# Patient Record
Sex: Female | Born: 1980
Health system: Southern US, Community
[De-identification: ages and names within clinical notes are randomized; demographics above are authoritative.]

---

## 2013-03-29 ENCOUNTER — Emergency Department (HOSPITAL_COMMUNITY)
Admission: EM | Admit: 2013-03-29 | Discharge: 2013-03-29 | Disposition: A | Discharge status: Home / Self Care | Payer: BC Managed Care – PPO | Attending: Emergency Medicine | Admitting: Emergency Medicine

## 2013-03-29 ENCOUNTER — Encounter (HOSPITAL_COMMUNITY): Payer: Self-pay | Admitting: Emergency Medicine

## 2013-03-29 DIAGNOSIS — J029 Acute pharyngitis, unspecified: Secondary | ICD-10-CM | POA: Insufficient documentation

## 2013-03-29 LAB — RAPID STREP SCREEN (MED CTR MEBANE ONLY): Streptococcus, Group A Screen (Direct): NEGATIVE

## 2013-03-29 MED ORDER — DEXAMETHASONE SODIUM PHOSPHATE 10 MG/ML IJ SOLN
10.0000 mg | Freq: Once | INTRAMUSCULAR | Status: AC
  Administered 2013-03-29: 10 mg via INTRAMUSCULAR
  Filled 2013-03-29: qty 1

## 2013-03-29 NOTE — ED Provider Notes (Signed)
CSN: [id number]     Arrival date & time 03/29/13  1550 History  This chart was scribed for non-physician practitioner, Teressa Lower, NP,working with Candyce Churn, MD, by Karle Plumber, ED Scribe.  This patient was seen in room TR08C/TR08C and the patient's care was started at 4:10 PM.  Chief Complaint  Patient presents with  . Sore Throat   The history is provided by the patient. No language interpreter was used.   HPI Comments:  Claire Ellison is a 32 y.o. female who presents to the Emergency Department complaining of generalized body aches, sore throat, and subjective fevers onset 3 days. She states her pain worsens with coughing. Pt states she has taken Zinc tablets and drank Ginseng for her symptoms.    History reviewed. No pertinent past medical history. History reviewed. No pertinent past surgical history. History reviewed. No pertinent family history. History  Substance Use Topics  . Smoking status: Never Smoker   . Smokeless tobacco: Not on file  . Alcohol Use: No   OB History   Grav Para Term Preterm Abortions TAB SAB Ect Mult Living                 Review of Systems  Constitutional: Positive for fever.  HENT: Positive for sore throat.   Neurological: Positive for headaches.  All other systems reviewed and are negative.    Allergies  Review of patient's allergies indicates no known allergies.  Home Medications  No current outpatient prescriptions on file. BP 117/67  Pulse 108  Temp(Src) 99.1 F (37.3 C) (Oral)  Resp 16  SpO2 98%  LMP 03/02/2013 Physical Exam  Nursing note and vitals reviewed. Constitutional: She is oriented to person, place, and time. She appears well-developed and well-nourished. No distress.  HENT:  Head: Normocephalic and atraumatic.  Mouth/Throat: No oropharyngeal exudate.  Throat mildly erythematous.   Eyes: Conjunctivae are normal. No scleral icterus.  Neck: Neck supple.  Cardiovascular: Normal rate and intact distal  pulses.   Pulmonary/Chest: Effort normal and breath sounds normal. No stridor. No respiratory distress. She has no wheezes. She has no rales.  Abdominal: Normal appearance. She exhibits no distension.  Neurological: She is alert and oriented to person, place, and time.  Skin: Skin is warm and dry. No rash noted.  Psychiatric: She has a normal mood and affect. Her behavior is normal.    ED Course  Procedures (including critical care time) DIAGNOSTIC STUDIES: Oxygen Saturation is 98% on RA, normal by my interpretation.   COORDINATION OF CARE: 4:14 PM- Will check strep test. Pt verbalizes understanding and agrees to plan.  Medications - No data to display  Labs Review Labs Reviewed  RAPID STREP SCREEN  CULTURE, GROUP A STREP   Imaging Review No results found.  EKG Interpretation   None       MDM   1. Pharyngitis    Likely viral:pt given steroids:don't need antibiotics at this time  I personally performed the services described in this documentation, which was scribed in my presence. The recorded information has been reviewed and is accurate.    Teressa Lower, NP 03/29/13 1649

## 2013-03-29 NOTE — ED Notes (Signed)
Pt reports sore throat, fever, headache, all over body aches, and laryngitis x4 days

## 2013-03-30 NOTE — ED Provider Notes (Signed)
Medical screening examination/treatment/procedure(s) were performed by non-physician practitioner and as supervising physician I was immediately available for consultation/collaboration.  Candyce Churn, MD 03/30/13 [phone]

## 2013-03-31 LAB — CULTURE, GROUP A STREP

## 2013-04-01 ENCOUNTER — Emergency Department (HOSPITAL_COMMUNITY)
Admission: EM | Admit: 2013-04-01 | Discharge: 2013-04-01 | Disposition: A | Discharge status: Home / Self Care | Payer: BC Managed Care – PPO | Source: Home / Self Care | Attending: Emergency Medicine | Admitting: Emergency Medicine

## 2013-04-01 ENCOUNTER — Emergency Department (INDEPENDENT_AMBULATORY_CARE_PROVIDER_SITE_OTHER): Payer: BC Managed Care – PPO

## 2013-04-01 ENCOUNTER — Encounter (HOSPITAL_COMMUNITY): Payer: Self-pay | Admitting: Emergency Medicine

## 2013-04-01 DIAGNOSIS — J040 Acute laryngitis: Secondary | ICD-10-CM

## 2013-04-01 LAB — POCT RAPID STREP A: Streptococcus, Group A Screen (Direct): NEGATIVE

## 2013-04-01 MED ORDER — AMOXICILLIN-POT CLAVULANATE 875-125 MG PO TABS: 1.0000 | ORAL_TABLET | Freq: Two times a day (BID) | ORAL | Status: AC

## 2013-04-01 MED ORDER — HYDROCOD POLST-CHLORPHEN POLST 10-8 MG/5ML PO LQCR: 5.0000 mL | Freq: Two times a day (BID) | ORAL | Status: AC | PRN

## 2013-04-01 MED ORDER — PREDNISONE 20 MG PO TABS: 20.0000 mg | ORAL_TABLET | Freq: Two times a day (BID) | ORAL | Status: AC

## 2013-04-01 NOTE — ED Provider Notes (Signed)
Chief Complaint:   Chief Complaint  Patient presents with  . 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 She went to the emergency room this past Sunday, 3 days ago. A rapid strep antigen was negative and her culture was negative as well. She was given Decadron, but feels no better today.  Review of Systems:  Other than noted above, the patient denies any of the following symptoms: Systemic:  No fevers, chills, sweats, weight loss or gain, fatigue, or tiredness. Eye:  No redness or discharge. ENT:  No ear pain, drainage, headache, nasal congestion, drainage, sinus pressure, difficulty swallowing, or sore throat. Neck:  No neck pain or swollen glands. Lungs:  No cough, sputum production, hemoptysis, wheezing, chest tightness, shortness of breath or chest pain. GI:  No abdominal pain, nausea, vomiting or diarrhea.  PMFSH:  Past medical history, family history, social history, meds, and allergies were reviewed.   Physical Exam:   Vital signs:  BP 99/56  Pulse 78  Temp(Src) 98.2 F (36.8 C) (Oral)  Resp 16  SpO2 99%  LMP 03/31/2013 General:  Alert and oriented.  In no distress.  Skin warm and dry. Eye:  No conjunctival injection or drainage. Lids were normal. ENT:  TMs and canals were normal, without erythema or inflammation.  Nasal mucosa was clear and uncongested, without drainage.  Mucous membranes were moist.  Pharynx was erythematous with cobblestoning.  There were no oral ulcerations or lesions. Neck:  Supple, no adenopathy, tenderness or mass. Lungs:  No respiratory distress.  Lungs were clear to auscultation, without wheezes, rales or rhonchi.  Breath sounds were clear and equal bilaterally.  Heart:   Regular rhythm, without gallops, murmers or rubs. Skin:  Clear, warm, and dry, without rash or lesions.  Radiology:  Dg Chest 2 View  04/01/2013   CLINICAL DATA:  Chest pain and shortness of breath; cough  EXAM: CHEST  2 VIEW  COMPARISON:  None.  FINDINGS: The lungs are clear. Heart size and pulmonary vascularity are normal. No adenopathy. No pneumothorax. No bone lesions.  IMPRESSION: No edema or consolidation.   Electronically Signed   By: Bretta Bang M.D.   On: 04/01/2013 15:00   Assessment:  The encounter diagnosis was Laryngitis.  She's had prolonged laryngitis, possibly with an element of sinusitis as well. She needs complete voice rest and was given a note to be out of work for 3 days.  Plan:   1.  Meds:  The following meds were prescribed:   Discharge Medication List as of 04/01/2013  3:16 PM    START taking these medications   Details  amoxicillin-clavulanate (AUGMENTIN) 875-125 MG per tablet Take 1 tablet by mouth 2 (two) times daily., Starting 04/01/2013, Until Discontinued, Print    chlorpheniramine-HYDROcodone (TUSSIONEX) 10-8 MG/5ML LQCR Take 5 mLs by mouth every 12 (twelve) hours as needed for cough., Starting 04/01/2013, Until Discontinued, Print    predniSONE (DELTASONE) 20 MG tablet Take 1 tablet (20 mg total) by mouth 2 (two) times daily., Starting 04/01/2013, Until Discontinued, Print        2.  Patient Education/Counseling:  The patient was given appropriate handouts, self care instructions, and instructed in symptomatic relief.    3.  Follow up:  The patient was  told to follow up if no better in 7 days, if becoming worse in any way, and given some red flag symptoms such as fever or difficulty breathing which would prompt immediate return.  Follow up here if no better in one week.      Reuben Likes, MD 04/01/13 [phone]

## 2013-04-01 NOTE — ED Notes (Signed)
Pt reports sore throat and fever.  She was seen in our ED 2 days ago and received an injection of decadron.  Today she c/o continued sore throat

## 2013-04-03 LAB — CULTURE, GROUP A STREP

## 2016-11-26 ENCOUNTER — Emergency Department (HOSPITAL_COMMUNITY): Payer: Managed Care, Other (non HMO)

## 2016-11-26 ENCOUNTER — Encounter (HOSPITAL_COMMUNITY): Payer: Self-pay

## 2016-11-26 ENCOUNTER — Emergency Department (HOSPITAL_COMMUNITY)
Admission: EM | Admit: 2016-11-26 | Discharge: 2016-11-27 | Disposition: A | Discharge status: Home / Self Care | Payer: Managed Care, Other (non HMO) | Attending: Emergency Medicine | Admitting: Emergency Medicine

## 2016-11-26 DIAGNOSIS — Y929 Unspecified place or not applicable: Secondary | ICD-10-CM | POA: Diagnosis not present

## 2016-11-26 DIAGNOSIS — Y998 Other external cause status: Secondary | ICD-10-CM | POA: Insufficient documentation

## 2016-11-26 DIAGNOSIS — S99921A Unspecified injury of right foot, initial encounter: Secondary | ICD-10-CM | POA: Diagnosis present

## 2016-11-26 DIAGNOSIS — Y9368 Activity, volleyball (beach) (court): Secondary | ICD-10-CM | POA: Diagnosis not present

## 2016-11-26 DIAGNOSIS — W010XXA Fall on same level from slipping, tripping and stumbling without subsequent striking against object, initial encounter: Secondary | ICD-10-CM | POA: Insufficient documentation

## 2016-11-26 DIAGNOSIS — S82821A Torus fracture of lower end of right fibula, initial encounter for closed fracture: Secondary | ICD-10-CM | POA: Diagnosis not present

## 2016-11-26 MED ORDER — HYDROCODONE-ACETAMINOPHEN 5-325 MG PO TABS
1.0000 | ORAL_TABLET | Freq: Once | ORAL | Status: AC
  Administered 2016-11-26: 1 via ORAL
  Filled 2016-11-26: qty 1

## 2016-11-26 NOTE — ED Provider Notes (Signed)
[facility]-EMERGENCY DEPT Provider Note   CSN: [id number] Arrival date & time: 11/26/16  2139 By signing my name below, I, Levon Hedger, attest that this documentation has been prepared under the direction and in the presence of non-physician practitioner, Kerrie Buffalo, NP. Electronically Signed: Levon Hedger, Scribe. 11/26/2016. 11:33 PM   History   Chief Complaint Chief Complaint  Patient presents with  . Foot Injury   HPI Claire Ellison is a 36 y.o. female who presents to the Emergency Department complaining of sudden onset, waxing and waning right ankle pain onset 30 minutes PTA. Pt was playing volleyball and jumped to hit the ball when came down on her right ankle and twisted it. Associated swelling noted to her right ankle. No OTC treatments tried for these symptoms PTA.  She denies any weakness or numbness. Pt has no other acute complaints or associated symptoms at this time.    The history is provided by the patient. No language interpreter was used.  Foot Injury   The incident occurred less than 1 hour ago. The injury mechanism was a fall. The pain is present in the right ankle. The pain is at a severity of 9/10. The pain is severe. The pain has been fluctuating since onset. Associated symptoms include inability to bear weight. Pertinent negatives include no numbness, no loss of motion, no muscle weakness, no loss of sensation and no tingling. She reports no foreign bodies present. The symptoms are aggravated by bearing weight, palpation and activity. She has tried nothing for the symptoms. The treatment provided no relief.   History reviewed. No pertinent past medical history.  There are no active problems to display for this patient.   History reviewed. No pertinent surgical history.  OB History    No data available      Home Medications    Prior to Admission medications   Medication Sig Start Date End Date Taking? Authorizing Provider  acetaminophen (TYLENOL) 500 MG tablet  Take 1,000 mg by mouth daily as needed for headache.    [provider]  amoxicillin-clavulanate (AUGMENTIN) 875-125 MG per tablet Take 1 tablet by mouth 2 (two) times daily. 04/01/13   Reuben Likes, MD  chlorpheniramine-HYDROcodone (TUSSIONEX) 10-8 MG/5ML LQCR Take 5 mLs by mouth every 12 (twelve) hours as needed for cough. 04/01/13   Reuben Likes, MD  diclofenac (VOLTAREN) 50 MG EC tablet Take 1 tablet (50 mg total) by mouth 2 (two) times daily. 11/27/16   Janne Napoleon, NP  HYDROcodone-acetaminophen (NORCO) 5-325 MG tablet Take 1 tablet by mouth every 6 (six) hours as needed. 11/27/16   Janne Napoleon, NP  predniSONE (DELTASONE) 20 MG tablet Take 1 tablet (20 mg total) by mouth 2 (two) times daily. 04/01/13   Reuben Likes, MD  Pseudoeph-CPM-DM-APAP (TYLENOL COLD) 30-2-15-325 MG TABS Take 1 tablet by mouth 2 (two) times daily as needed (for cold symptoms).    [provider]    Family History History reviewed. No pertinent family history.  Social History Social History  Substance Use Topics  . Smoking status: Never Smoker  . Smokeless tobacco: Never Used  . Alcohol use No     Allergies   Patient has no known allergies.   Review of Systems Review of Systems  Musculoskeletal: Positive for arthralgias, joint swelling and myalgias.  Neurological: Negative for tingling, weakness and numbness.  All other systems reviewed and are negative.  Physical Exam Updated Vital Signs BP (!) 110/57   Pulse 82  Temp 98.7 F (37.1 C) (Oral)   Resp 18   Ht 5\' 2"  (1.575 m)   Wt 54 kg (119 lb 0.8 oz)   LMP 10/29/2016   SpO2 98%   BMI 21.77 kg/m   Physical Exam  Constitutional: She is oriented to person, place, and time. She appears well-developed and well-nourished. No distress.  HENT:  Head: Normocephalic and atraumatic.  Eyes: Conjunctivae are normal.  Cardiovascular: Normal rate.   PT pulses 2+, adequate circulation  Pulmonary/Chest: Effort normal.    Abdominal: She exhibits no distension.  Neurological: She is alert and oriented to person, place, and time.  Swelling and tenderness to lateral aspect of right ankle  No achilles defect or tenderness  Skin: Skin is warm and dry.  Nursing note and vitals reviewed.  ED Treatments / Results  DIAGNOSTIC STUDIES:  Oxygen Saturation is 98% on RA, nromal by my interpretation.    COORDINATION OF CARE:  11:30 PM Discussed treatment plan with pt at bedside and pt agreed to plan.   Labs (all labs ordered are listed, but only abnormal results are displayed) Labs Reviewed - No data to display  Radiology No results found.  Procedures Procedures (including critical care time)  Medications Ordered in ED Medications  HYDROcodone-acetaminophen (NORCO/VICODIN) 5-325 MG per tablet 1 tablet (1 tablet Oral Given 11/26/16 2335)     Initial Impression / Assessment and Plan / ED Course  I have reviewed the triage vital signs and the nursing notes. 36 y.o. female with right ankle pain s/p fall stable for d/c without focal neuro deficits. Posterior splint applied, crutches, pain management, ice elevation and f/u with ortho.  Diagnosis was discussed with patient who verbalizes understanding and is agreeable to discharge. Pt case discussed with Dr. Rosalia Hammersay and x-rays reviewed with her she  agrees with my plan.   SPLINT APPLICATION Date/Time: 5:17 PM Authorized by: Kerrie BuffaloNEESE,Anastasha Ortez Consent: Verbal consent obtained. Risks and benefits: risks, benefits and alternatives were discussed Consent given by: patient Splint applied by: orthopedic technician Location details: right lower leg Splint type: posterior Post-procedure: The splinted body part was neurovascularly unchanged following the procedure. Patient tolerance: Patient tolerated the procedure well with no immediate complications.     Final Clinical Impressions(s) / ED Diagnoses   Final diagnoses:  Closed torus fracture of distal end of right fibula,  initial encounter    New Prescriptions Discharge Medication List as of 11/27/2016 12:13 AM    START taking these medications   Details  diclofenac (VOLTAREN) 50 MG EC tablet Take 1 tablet (50 mg total) by mouth 2 (two) times daily., Starting Mon 11/27/2016, Print    HYDROcodone-acetaminophen (NORCO) 5-325 MG tablet Take 1 tablet by mouth every 6 (six) hours as needed., Starting Mon 11/27/2016, Print       I personally performed the services described in this documentation, which was scribed in my presence. The recorded information has been reviewed and is accurate.    Kerrie Buffaloeese, Keionna Kinnaird [place], [place] 11/29/16 1718    Margarita Grizzleay, Danielle, MD 12/08/16 1536

## 2016-11-26 NOTE — ED Triage Notes (Signed)
Onset 30 min PTA pt was jumping and came down on right ankle, ankle painful and swollen.

## 2016-11-27 MED ORDER — DICLOFENAC SODIUM 50 MG PO TBEC: 50.0000 mg | DELAYED_RELEASE_TABLET | Freq: Two times a day (BID) | ORAL | 0 refills | Status: AC

## 2016-11-27 MED ORDER — HYDROCODONE-ACETAMINOPHEN 5-325 MG PO TABS: 1.0000 | ORAL_TABLET | Freq: Four times a day (QID) | ORAL | 0 refills | Status: AC | PRN

## 2016-11-27 NOTE — ED Notes (Addendum)
Pt stable, ambulatory, states understanding of discharge instructions, family at bedside. 

## 2016-11-27 NOTE — Progress Notes (Signed)
Orthopedic Tech Progress Note Patient Details:  Bethann GooDilek Bera December 31, 1980 [id number]  Ortho Devices Type of Ortho Device: Post (short leg) splint, Crutches Ortho Device/Splint Location: rle Ortho Device/Splint Interventions: Ordered, Application, Adjustment   Trinna PostMartinez, Deyra Perdomo J 11/27/2016, 12:45 AM

## 2016-11-27 NOTE — Discharge Instructions (Signed)
The narcotic pain medication can make you sleepy.  Call Dr. Aundria Rudogers' office in the morning to schedule follow up. Return here as needed.

## 2019-01-21 IMAGING — CR DG ANKLE COMPLETE 3+V*R*
3 series · 3 of 3 positions shown · non-contrast
Comparison: None.

CLINICAL DATA: Right ankle pain after rolling the ankle during
volleyball.

EXAM:
RIGHT ANKLE - COMPLETE 3+ VIEW

[ankle ap]
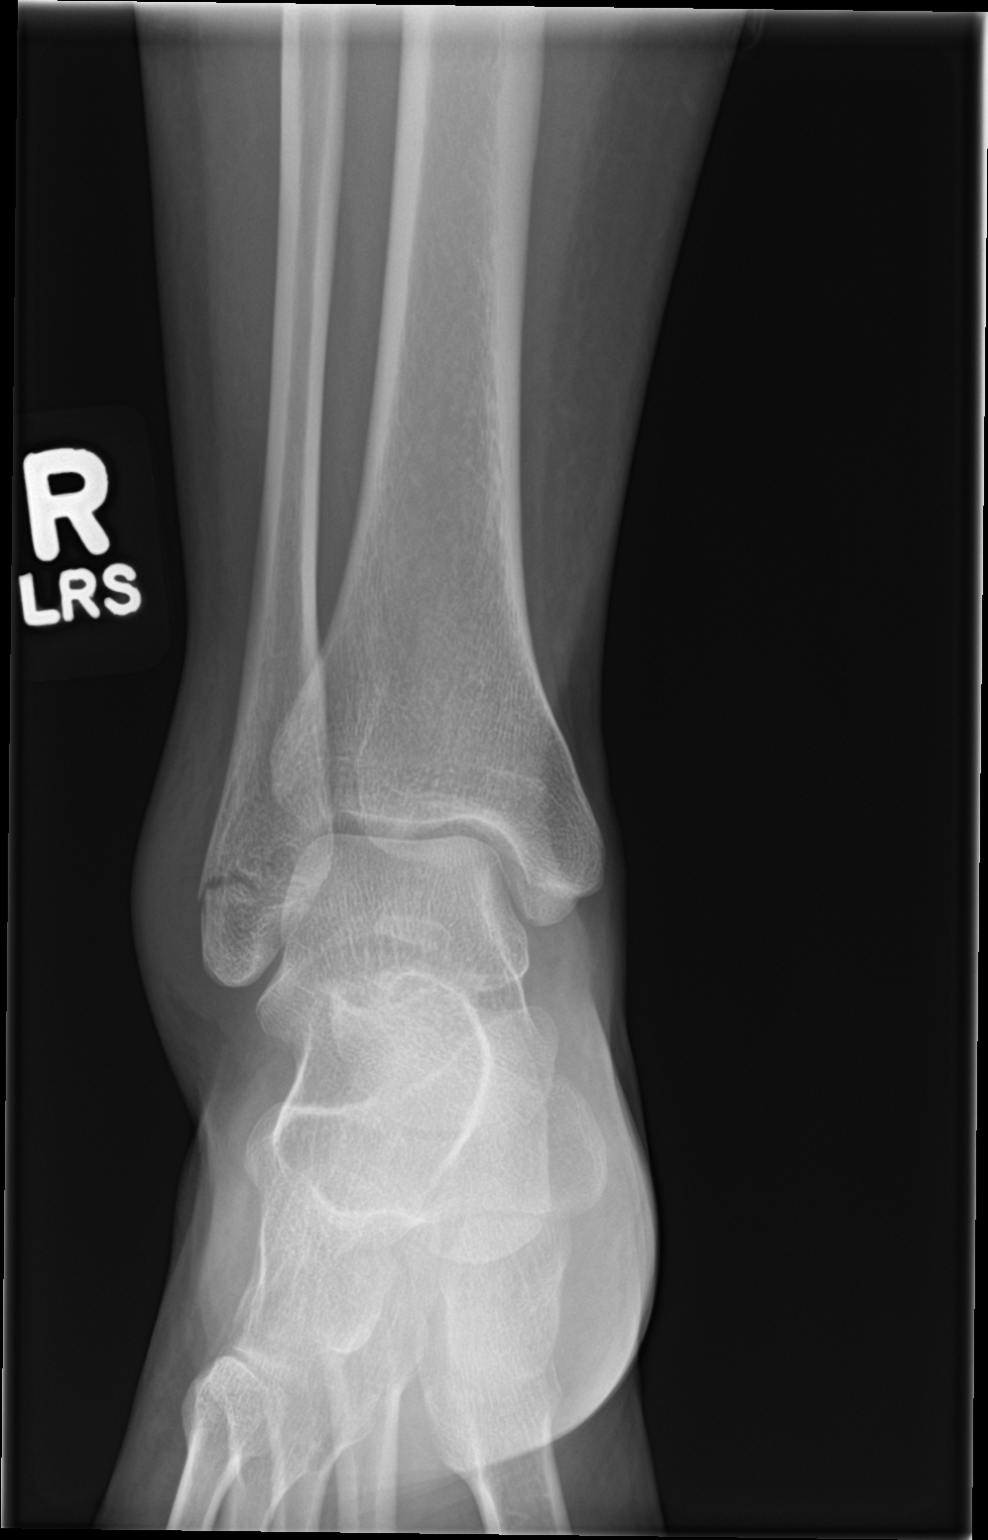

[ankle obl]
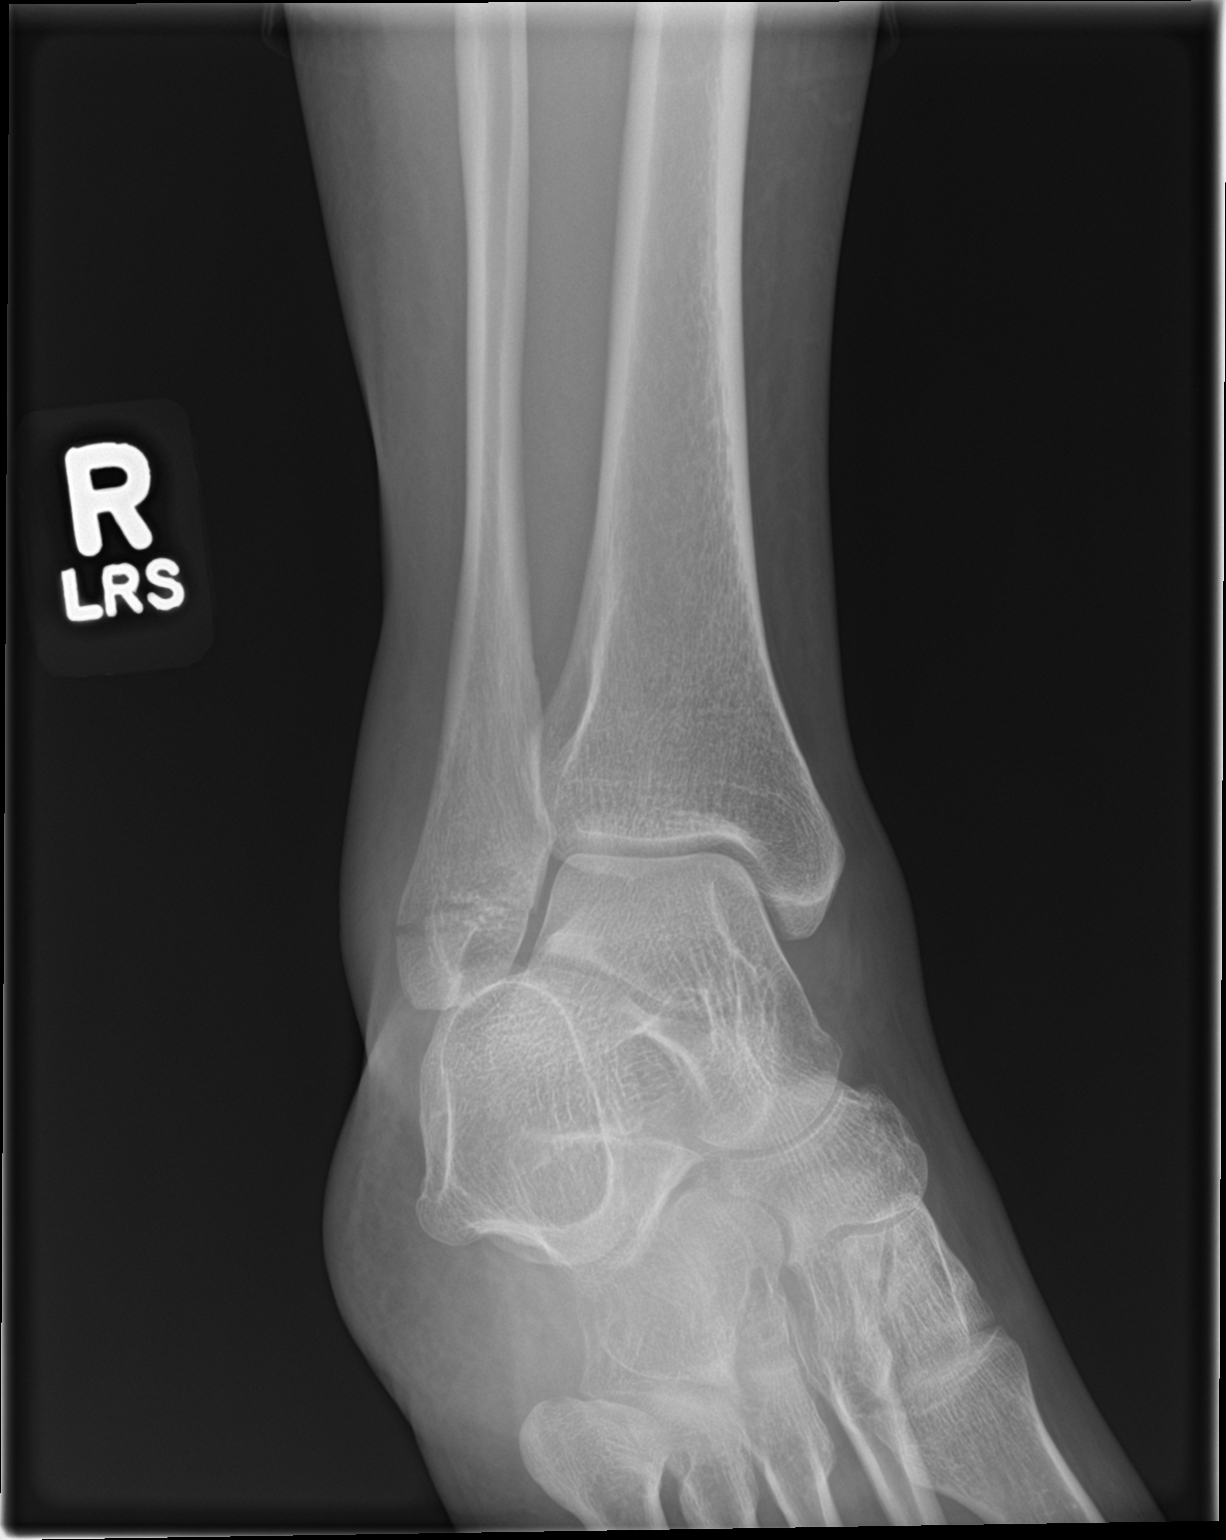

[ankle lat]
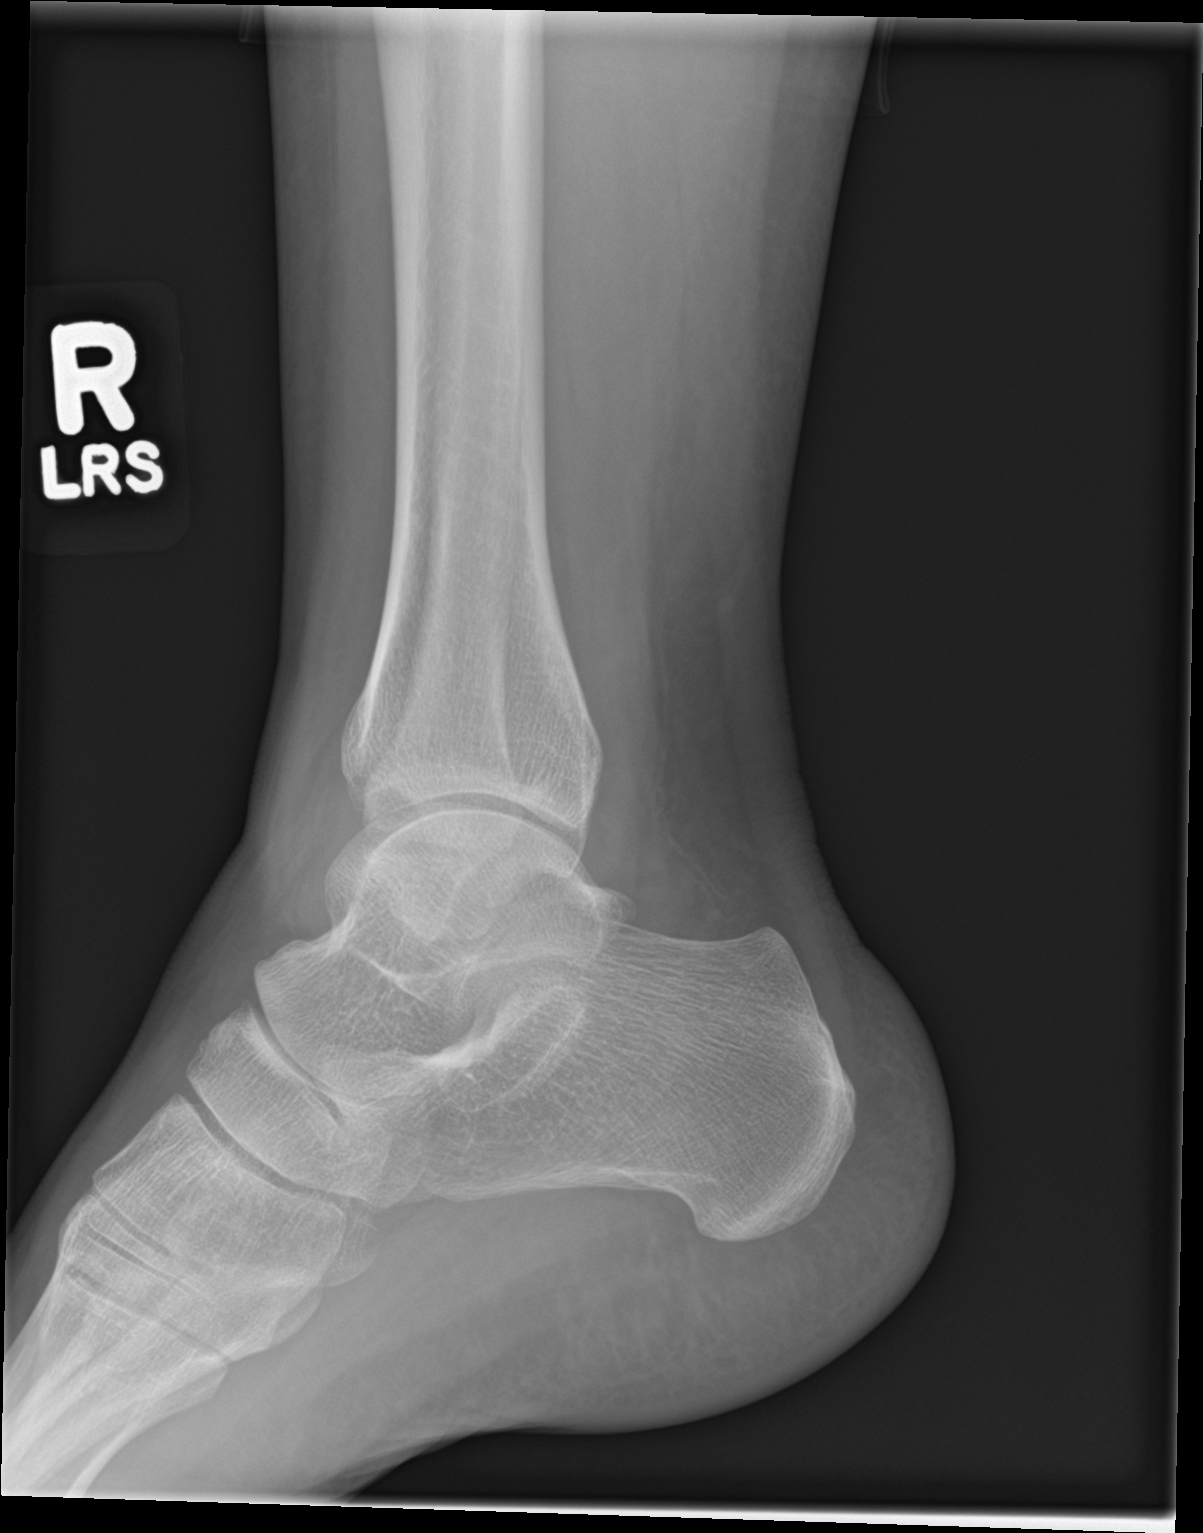

[3 of 3 positions shown; findings below may reference images not displayed]

FINDINGS: There is an acute closed transverse fracture of the distal fibula
and with overlying soft tissue swelling. Ankle mortise is
maintained. Small ankle joint effusion is identified airway cephalad
metatarsal appears intact.
IMPRESSION: Acute, nondisplaced transverse fracture of the distal fibula with
ankle joint effusion and overlying soft tissue swelling.

## 2021-05-13 ENCOUNTER — Emergency Department
Admission: EM | Admit: 2021-05-13 | Discharge: 2021-05-13 | Disposition: A | Discharge status: Home / Self Care | Payer: BC Managed Care – PPO | Attending: Emergency Medicine | Admitting: Emergency Medicine

## 2021-05-13 DIAGNOSIS — R110 Nausea: Secondary | ICD-10-CM | POA: Insufficient documentation

## 2021-05-13 DIAGNOSIS — R1084 Generalized abdominal pain: Secondary | ICD-10-CM | POA: Insufficient documentation

## 2021-05-13 DIAGNOSIS — U071 COVID-19: Secondary | ICD-10-CM | POA: Insufficient documentation

## 2021-05-13 LAB — CBC AND DIFFERENTIAL
Absolute NRBC: 0 10*3/uL (ref 0.00–0.00)
Basophils Absolute Automated: 0.01 10*3/uL (ref 0.00–0.08)
Basophils Automated: 0.2 %
Eosinophils Absolute Automated: 0.11 10*3/uL (ref 0.00–0.44)
Eosinophils Automated: 2.3 %
Hematocrit: 37.6 % (ref 34.7–43.7)
Hgb: 12.6 g/dL (ref 11.4–14.8)
Immature Granulocytes Absolute: 0.01 10*3/uL (ref 0.00–0.07)
Immature Granulocytes: 0.2 %
Lymphocytes Absolute Automated: 0.62 10*3/uL (ref 0.42–3.22)
Lymphocytes Automated: 12.8 %
MCH: 28 pg (ref 25.1–33.5)
MCHC: 33.5 g/dL (ref 31.5–35.8)
MCV: 83.6 fL (ref 78.0–96.0)
MPV: 10 fL (ref 8.9–12.5)
Monocytes Absolute Automated: 0.21 10*3/uL (ref 0.21–0.85)
Monocytes: 4.3 %
Neutrophils Absolute: 3.9 10*3/uL (ref 1.10–6.33)
Neutrophils: 80.2 %
Nucleated RBC: 0 /100 WBC (ref 0.0–0.0)
Platelets: 261 10*3/uL (ref 142–346)
RBC: 4.5 10*6/uL (ref 3.90–5.10)
RDW: 13 % (ref 11–15)
WBC: 4.86 10*3/uL (ref 3.10–9.50)

## 2021-05-13 LAB — URINALYSIS, REFLEX TO MICROSCOPIC EXAM IF INDICATED
Bilirubin, UA: NEGATIVE
Blood, UA: NEGATIVE
Glucose, UA: NEGATIVE
Leukocyte Esterase, UA: NEGATIVE
Nitrite, UA: NEGATIVE
Protein, UR: NEGATIVE
Specific Gravity UA: 1.01 (ref 1.001–1.035)
Urine pH: 5.5 (ref 5.0–8.0)
Urobilinogen, UA: 0.2 mg/dL (ref 0.2–2.0)
WBC, UA: NONE SEEN /hpf (ref 0–5)

## 2021-05-13 LAB — COMPREHENSIVE METABOLIC PANEL
ALT: 19 U/L (ref 0–55)
AST (SGOT): 22 U/L (ref 5–41)
Albumin/Globulin Ratio: 1.3 (ref 0.9–2.2)
Albumin: 4.2 g/dL (ref 3.5–5.0)
Alkaline Phosphatase: 57 U/L (ref 37–117)
Anion Gap: 8 (ref 5.0–15.0)
BUN: 8 mg/dL (ref 7.0–21.0)
Bilirubin, Total: 0.6 mg/dL (ref 0.2–1.2)
CO2: 23 mEq/L (ref 17–29)
Calcium: 9.1 mg/dL (ref 8.5–10.5)
Chloride: 106 mEq/L (ref 99–111)
Creatinine: 0.7 mg/dL (ref 0.4–1.0)
Globulin: 3.2 g/dL (ref 2.0–3.6)
Glucose: 95 mg/dL (ref 70–100)
Potassium: 3.7 mEq/L (ref 3.5–5.3)
Protein, Total: 7.4 g/dL (ref 6.0–8.3)
Sodium: 137 mEq/L (ref 135–145)

## 2021-05-13 LAB — COVID-19 (SARS-COV-2): SARS CoV-2: POSITIVE — AB

## 2021-05-13 LAB — GFR: EGFR: >60

## 2021-05-13 LAB — LIPASE: Lipase: 12 U/L (ref 8–78)

## 2021-05-13 LAB — URINE HCG QUALITATIVE: Urine HCG Qualitative: NEGATIVE

## 2021-05-13 MED ORDER — DICYCLOMINE HCL 10 MG/ML IM SOLN
10.0000 mg | Freq: Once | INTRAMUSCULAR | Status: AC
Start: 2021-05-13 — End: 2021-05-13
  Administered 2021-05-13: 08:00:00 10 mg via INTRAMUSCULAR
  Filled 2021-05-13: qty 2

## 2021-05-13 MED ORDER — SODIUM CHLORIDE 0.9 % IV BOLUS
1000.0000 mL | Freq: Once | INTRAVENOUS | Status: AC
Start: 2021-05-13 — End: 2021-05-13
  Administered 2021-05-13: 08:00:00 1000 mL via INTRAVENOUS

## 2021-05-13 MED ORDER — DICYCLOMINE HCL 20 MG PO TABS
20.0000 mg | ORAL_TABLET | Freq: Three times a day (TID) | ORAL | 0 refills | Status: AC | PRN
Start: 2021-05-13 — End: 2021-05-18

## 2021-05-13 MED ORDER — ONDANSETRON 4 MG PO TBDP
4.0000 mg | ORAL_TABLET | Freq: Four times a day (QID) | ORAL | 0 refills | Status: AC | PRN
Start: 2021-05-13 — End: ?

## 2021-05-13 MED ORDER — IBUPROFEN 400 MG PO TABS
400.0000 mg | ORAL_TABLET | Freq: Once | ORAL | Status: AC
Start: 2021-05-13 — End: 2021-05-13
  Administered 2021-05-13: 08:00:00 400 mg via ORAL
  Filled 2021-05-13: qty 1

## 2021-05-13 MED ORDER — ALUM & MAG HYDROXIDE-SIMETH 200-200-20 MG/5ML PO SUSP
30.0000 mL | Freq: Once | ORAL | Status: AC
Start: 2021-05-13 — End: 2021-05-13
  Administered 2021-05-13: 08:00:00 30 mL via ORAL
  Filled 2021-05-13: qty 30

## 2021-05-13 MED ORDER — IBUPROFEN 400 MG PO TABS
200.0000 mg | ORAL_TABLET | Freq: Once | ORAL | Status: DC
Start: 2021-05-13 — End: 2021-05-13

## 2021-05-13 MED ORDER — NIRMATRELVIR&RITONAVIR 300/100 20 X 150 MG & 10 X 100MG PO TBPK
3.0000 | ORAL_TABLET | Freq: Two times a day (BID) | ORAL | 0 refills | Status: AC
Start: 2021-05-13 — End: 2021-05-18

## 2021-05-13 NOTE — Discharge Instructions (Addendum)
You have been evaluated in the Emergency Department today for abdominal pain. Your evaluation was not suggestive of any emergent condition requiring medical intervention at this time. However, some abdominal problems make take more time to appear. Therefore, it is important for you to watch for any new symptoms or worsening of your current condition. Return to the Emergency Department if you experience worsening pain, persistent fevers greater than 100.4, recurrent vomiting, blood in vomit, blood in stool, dark tarry stool, chest pain, difficulty breathing, or any other concerning symptoms.

## 2021-05-13 NOTE — ED Provider Notes (Signed)
EMERGENCY DEPARTMENT NOTE     Patient initially seen and examined at   ED PHYSICIAN ASSIGNED       Date/Time Event User Comments    05/13/21 0808 Physician Assigned Dulce Sellar, Ege Muckey E. Larose Kells, MD assigned as Attending           ED [facility] (APP) ASSIGNED       None            HISTORY OF PRESENT ILLNESS   Independent Historian:yes  Translator Used: no    Chief Complaint: Abdominal Pain and Diarrhea       40 y.o. female with no PMH presents with c/o diffuse abdominal pain and nausea since yesterday.  She had 1 episode of loose stool yesterday.  Reports associated headache, lightheadedness, and nausea.  Denies fevers, chills, vomiting. Pain comes in waves and described as "something hitting me with a hammer"  Cannot identify any inciting factors.  Has not taken any medications in attempt to alleviate her symptoms.    Location of symptoms: Diffuse  Onset of symptoms: Yesterday  What was patient doing when symptoms started (Context): see above  Severity: moderate  Timing: Intermittent  Activities that worsen symptoms: None  Activities that improve symptoms: None  Quality: Hammer  Radiation of symptoms: no  Associated signs and Symptoms: see above  Are symptoms worsening? yes  MEDICAL HISTORY     Past Medical History:  History reviewed. No pertinent past medical history.    Past Surgical History:  History reviewed. No pertinent surgical history.    Social History:  Social History     Socioeconomic History    Marital status: Married   Tobacco Use    Smoking status: Never    Smokeless tobacco: Never   Vaping Use    Vaping Use: Never used   Substance and Sexual Activity    Alcohol use: Never    Drug use: Never       Family History:  History reviewed. No pertinent family history.    Outpatient Medication:  Previous Medications    No medications on file         REVIEW OF SYSTEMS   Review of Systems   Constitutional:  Negative for chills and fever.   Respiratory:  Negative for chest tightness and shortness of breath.     Cardiovascular:  Negative for chest pain and palpitations.   Gastrointestinal:  Positive for abdominal pain. Negative for blood in stool, constipation, nausea and vomiting.        1 loose stool   Genitourinary:  Positive for flank pain. Negative for difficulty urinating, dysuria and hematuria.   Musculoskeletal:  Negative for back pain.   Neurological:  Positive for headaches.   All other systems reviewed and are negative.    PHYSICAL EXAM     ED Triage Vitals [05/13/21 0736]   Enc Vitals Group      BP 110/66      Heart Rate 98      Resp Rate 16      Temp 99.3 F (37.4 C)      Temp Source Temporal      SpO2 99 %      Weight 55.8 kg      Height       Head Circumference       Peak Flow       Pain Score 7      Pain Loc       Pain Edu?  Excl. in GC?      Physical Exam  Vitals and nursing note reviewed.   Constitutional:       General: She is not in acute distress.  Cardiovascular:      Rate and Rhythm: Normal rate and regular rhythm.   Pulmonary:      Effort: Pulmonary effort is normal. No respiratory distress.      Breath sounds: Normal breath sounds.   Abdominal:      General: Abdomen is flat. Bowel sounds are normal. There is no distension.      Palpations: Abdomen is soft.      Tenderness: There is no abdominal tenderness. There is no right CVA tenderness, left CVA tenderness, guarding or rebound. Negative signs include Murphy's sign.   Neurological:      General: No focal deficit present.      Mental Status: She is alert.         MEDICAL DECISION MAKING     PRIMARY PROBLEM LIST     Acute Uncomplicated Moderate abdominal pain     Differential Diagnosis: Abdominal Pain: bowel obstruction, abscess, colitis, diverticulitis, appendicitis, peritonitis, bowel perforation, mesenteric ischemia, abdominal aortic aneurysm, cholecystitis, biliay colic, cholangitis                     DISCUSSION      40yoF yo patient presents with diffuse abdominal pain. Patient's symptoms not typical for emergent causes of abdominal  pain such as, but not limited to, appendicitis, abdominal aortic aneurysm, surgical biliary disease, pancreatitis, SBO, mesenteric ischemia, serious intra-abdominal bacterial illness, genital torsion.  Doubt atypical ACS.  Pt tolerating PO and well-appearing.    Found to be covid+ on ED testing today.   Likely that her symptoms are 2/2/ covid infection.    Disposition: Patient will be discharged with strict return precautions and follow up with primary MD or ER for further evaluation if abdo pain worsening over the next 24 hours    Patient understands that they may still have an early presentation of an emergent medical condition such as appendicitis that will require a recheck.      I do not suspect severe sepsis or septic shock        Additional Notes              ED Course as of 05/15/21 0630   Fri May 13, 2021   1054 Pt was re-evaluated, no active abdominal pain at this time, able to tolerate PO without difficulty. Pt was instructed to follow up with PMD or return to the ED should, despite treatment, the symptoms worsen at any time. Patient verbalized understanding and is agreeable  The patient does not have any signs or symptoms that merit any further medical or surgical intervention at this time.        [LY]      ED Course User Index  [LY] Larose Kells, MD         Vital Signs: Reviewed the patient's vital signs.   Nursing Notes: Reviewed and utilized available nursing notes.  Medical Records Reviewed: Reviewed available past medical records.  Counseling: The emergency provider has spoken with the patient and discussed today's findings, in addition to providing specific details for the plan of care.  Questions are answered and there is agreement with the plan.      MIPS DOCUMENTATION              CARDIAC STUDIES    The following cardiac  studies were independently interpreted by the Emergency Medicine Physician.  For full cardiac study results please see chart.    Monitor Strip  Interpreted by ED  Physician  Rate: 98  Rhythm: NSR   ST Changes: none      EMERGENCY IMAGING STUDIES    The following imagine studies were independently interpreted by me (emergency physician):        RADIOLOGY IMAGING STUDIES      No orders to display       EMERGENCY DEPT. MEDICATIONS      ED Medication Orders (From admission, onward)      Start Ordered     Status Ordering Provider    05/13/21 0815 05/13/21 0808  sodium chloride 0.9 % bolus 1,000 mL  Once        Route: Intravenous  Ordered Dose: 1,000 mL     Last MAR action: New Bag Shelina Luo E    05/13/21 0800 05/13/21 0758  dicyclomine (BENTYL) injection 10 mg  Once        Route: Intramuscular  Ordered Dose: 10 mg     Last MAR action: Given Kayman Snuffer E    05/13/21 0800 05/13/21 0758  alum & mag hydroxide-simethicone (MAALOX PLUS) 200-200-20 mg/5 mL suspension 30 mL  Once        Route: Oral  Ordered Dose: 30 mL     Last MAR action: Given Adalia Pettis E    05/13/21 0800 05/13/21 0758    Once        Route: Oral  Ordered Dose: 200 mg     Discontinued Nicolo Tomko E    05/13/21 0800 05/13/21 0759  ibuprofen (ADVIL) tablet 400 mg  Once        Route: Oral  Ordered Dose: 400 mg     Last MAR action: Given Geremiah Fussell E            LABORATORY RESULTS    Ordered and independently interpreted AVAILABLE laboratory tests.   Results       Procedure Component Value Units Date/Time    Comprehensive metabolic panel [[id number]] Collected: 05/13/21 0750    Specimen: Blood Updated: 05/13/21 0819     Glucose 95 mg/dL      BUN 8.0 mg/dL      Creatinine 0.7 mg/dL      Sodium 409 mEq/L      Potassium 3.7 mEq/L      Chloride 106 mEq/L      CO2 23 mEq/L      Calcium 9.1 mg/dL      Protein, Total 7.4 g/dL      Albumin 4.2 g/dL      AST (SGOT) 22 U/L      ALT 19 U/L      Alkaline Phosphatase 57 U/L      Bilirubin, Total 0.6 mg/dL      Globulin 3.2 g/dL      Albumin/Globulin Ratio 1.3     Anion Gap 8.0    Lipase [[id number]] Collected: 05/13/21 0750    Specimen: Blood Updated: 05/13/21 0819     Lipase 12 U/L      GFR [[id number]] Collected: 05/13/21 0750     Updated: 05/13/21 0819     EGFR >60.0       CBC and differential [[id number]] Collected: 05/13/21 0750    Specimen: Blood Updated: 05/13/21 0805     WBC 4.86 x10 3/uL      Hgb 12.6 g/dL      Hematocrit  37.6 %      Platelets 261 x10 3/uL      RBC 4.50 x10 6/uL      MCV 83.6 fL      MCH 28.0 pg      MCHC 33.5 g/dL      RDW 13 %      MPV 10.0 fL      Neutrophils 80.2 %      Lymphocytes Automated 12.8 %      Monocytes 4.3 %      Eosinophils Automated 2.3 %      Basophils Automated 0.2 %      Immature Granulocytes 0.2 %      Nucleated RBC 0.0 /100 WBC      Neutrophils Absolute 3.90 x10 3/uL      Lymphocytes Absolute Automated 0.62 x10 3/uL      Monocytes Absolute Automated 0.21 x10 3/uL      Eosinophils Absolute Automated 0.11 x10 3/uL      Basophils Absolute Automated 0.01 x10 3/uL      Immature Granulocytes Absolute 0.01 x10 3/uL      Absolute NRBC 0.00 x10 3/uL     COVID-19 (SARS-CoV-2) (ID Now) [[id number]] Collected: 05/13/21 0750    Specimen: Nasopharyngeal Swab from Nasopharynx Updated: 05/13/21 0803     Purpose of COVID testing Diagnostic -[facility]     SARS-CoV-2 Specimen Source Nasopharyngeal    Narrative:      o Collect and clearly label specimen type:  o Upper respiratory specimen: One Nasopharyngeal Dry Swab NO  Transport Media.  o Hand deliver to laboratory ASAP  Diagnostic -[facility]    Rapid influenza A/B antigens [[id number]] Collected: 05/13/21 0750    Specimen: Nasopharyngeal Swab from Nasal Aspirate Updated: 05/13/21 0803              CRITICAL CARE/PROCEDURES    Procedures      DIAGNOSIS      Diagnosis:  Final diagnoses:   None       Disposition:  ED Disposition       None            Prescriptions:  Patient's Medications    No medications on file       This note was generated by the [employer] EMR system/ Dragon speech recognition and may contain inherent errors or omissions not intended by the user. Grammatical errors, random word insertions, deletions and pronoun errors   are occasional consequences of this technology due to software limitations. Not all errors are caught or corrected. If there are questions or concerns about the content of this note or information contained within the body of this dictation they should be addressed directly with the author for clarification.       Larose Kells, MD  05/15/21 [phone]
